# Patient Record
Sex: Male | Born: 1970 | Race: Black or African American | Hispanic: No | Marital: Married | Smoking: Never smoker
Health system: Southern US, Community
[De-identification: ages and names within clinical notes are randomized; demographics above are authoritative.]

## PROBLEM LIST (undated history)

## (undated) DIAGNOSIS — I10 Essential (primary) hypertension: Secondary | ICD-10-CM

## (undated) DIAGNOSIS — F4310 Post-traumatic stress disorder, unspecified: Secondary | ICD-10-CM

## (undated) DIAGNOSIS — G4730 Sleep apnea, unspecified: Secondary | ICD-10-CM

## (undated) HISTORY — PX: WRIST SURGERY: SHX841

---

## 1998-09-05 ENCOUNTER — Emergency Department (HOSPITAL_COMMUNITY): Admission: EM | Admit: 1998-09-05 | Discharge: 1998-09-05 | Payer: Self-pay | Admitting: Emergency Medicine

## 2012-06-01 ENCOUNTER — Encounter (HOSPITAL_COMMUNITY): Payer: Self-pay | Admitting: *Deleted

## 2012-06-01 ENCOUNTER — Emergency Department (HOSPITAL_COMMUNITY): Payer: Self-pay

## 2012-06-01 ENCOUNTER — Emergency Department (HOSPITAL_COMMUNITY)
Admission: EM | Admit: 2012-06-01 | Discharge: 2012-06-01 | Disposition: A | Discharge status: Home / Self Care | Payer: Self-pay | Attending: Emergency Medicine | Admitting: Emergency Medicine

## 2012-06-01 DIAGNOSIS — R0789 Other chest pain: Secondary | ICD-10-CM

## 2012-06-01 DIAGNOSIS — R079 Chest pain, unspecified: Secondary | ICD-10-CM | POA: Insufficient documentation

## 2012-06-01 DIAGNOSIS — I10 Essential (primary) hypertension: Secondary | ICD-10-CM | POA: Insufficient documentation

## 2012-06-01 DIAGNOSIS — Z79899 Other long term (current) drug therapy: Secondary | ICD-10-CM | POA: Insufficient documentation

## 2012-06-01 HISTORY — DX: Essential (primary) hypertension: I10

## 2012-06-01 LAB — CBC WITH DIFFERENTIAL/PLATELET
Basophils Absolute: 0 10*3/uL (ref 0.0–0.1)
Eosinophils Relative: 3 % (ref 0–5)
Lymphocytes Relative: 42 % (ref 12–46)
MCV: 85.3 fL (ref 78.0–100.0)
Neutro Abs: 3.2 10*3/uL (ref 1.7–7.7)
Neutrophils Relative %: 45 % (ref 43–77)
Platelets: 284 10*3/uL (ref 150–400)
RBC: 4.64 MIL/uL (ref 4.22–5.81)
RDW: 12.6 % (ref 11.5–15.5)
WBC: 7.1 10*3/uL (ref 4.0–10.5)

## 2012-06-01 LAB — BASIC METABOLIC PANEL
Chloride: 98 mEq/L (ref 96–112)
GFR calc Af Amer: >90 mL/min (ref >90)
GFR calc non Af Amer: 81 mL/min — ABNORMAL LOW (ref >90)
Potassium: 3.4 mEq/L — ABNORMAL LOW (ref 3.5–5.1)
Sodium: 135 mEq/L (ref 135–145)

## 2012-06-01 LAB — POCT I-STAT TROPONIN I: Troponin i, poc: 0 ng/mL (ref 0.00–0.08)

## 2012-06-01 MED ORDER — POTASSIUM CHLORIDE ER 10 MEQ PO TBCR: 10.0000 meq | EXTENDED_RELEASE_TABLET | ORAL | Status: DC

## 2012-06-01 NOTE — ED Notes (Signed)
41 year old male with a history of intermittent palpitations over the last couple of years, he states that when he has sex or when he talks too much he feels like his heart races. He is not currently having any symptoms,  Physical exam: Patient appears well, soft abdomen, clear lungs, no ectopy.  Assessment:  Chest x-ray, I have personally interpreted this x-ray, no signs of effusions, infiltrates, pneumothorax or abnormal mediastinum. Heart size is normal limits. EKG shows no signs of ischemia or ectopy, potassium 3.4, replacement, followup in the outpatient setting for Holter monitor.  Medical screening examination/treatment/procedure(s) were conducted as a shared visit with non-physician practitioner(s) and myself.  I personally evaluated the patient during the encounter    Vida Roller, MD 06/01/12 [phone]

## 2012-06-01 NOTE — ED Provider Notes (Signed)
History     CSN: [id number]  Arrival date & time 06/01/12  0025   First MD Initiated Contact with Patient 06/01/12 0159      Chief Complaint  Patient presents with  . Chest Pain    (Consider location/radiation/quality/duration/timing/severity/associated sxs/prior treatment) HPI Comments: Patient states that for months he had a funny feeling in his chest with palpitations that is progressively getting worse Now he is feeling weak and very fatigued sleeping a lot His PCP is the [place] but had not been seen in over 6 months  He states he has gained a lot of weight in the past 6 months    Patient is a 41 y.o. male presenting with chest pain. The history is provided by the patient.  Chest Pain The chest pain began more  than 1 month ago. Chest pain occurs frequently. The chest pain is worsening. At its most intense, the pain is at 4/10. The pain is currently at 2/10. The severity of the pain is moderate. The quality of the pain is described as aching. The pain radiates to the left shoulder. Primary symptoms include palpitations. Pertinent negatives for primary symptoms include no fever, no nausea and no dizziness.  The palpitations did not occur with dizziness.  Associated symptoms include weakness.     Past Medical History  Diagnosis Date  . Hypertension     History reviewed. No pertinent past surgical history.  No family history on file.  History  Substance Use Topics  . Smoking status: Never Smoker   . Smokeless tobacco: Not on file  . Alcohol Use: Yes     social      Review of Systems  Constitutional: Negative for fever.  HENT: Negative for congestion and rhinorrhea.   Cardiovascular: Positive for chest pain and palpitations.  Gastrointestinal: Negative for nausea.  Genitourinary: Negative for dysuria.  Skin: Negative for rash.  Neurological: Positive for weakness. Negative for dizziness.    Allergies  Percocet  Home Medications   Current Outpatient Rx  Name  Route Sig Dispense Refill  . HYDROCHLOROTHIAZIDE 25 MG PO TABS Oral Take 25 mg by mouth daily.    Marland Kitchen LISINOPRIL 10 MG PO TABS Oral Take 10 mg by mouth daily.    Marland Kitchen POTASSIUM CHLORIDE ER 10 MEQ PO TBCR Oral Take 1 tablet (10 mEq total) by mouth 1 day or 1 dose. 19 tablet 0    BP 153/93  Pulse 84  Temp 98.5 F (36.9 C)  Resp 16  SpO2 96%  Physical Exam  Constitutional: He appears well-developed.  HENT:  Head: Normocephalic.  Eyes: Pupils are equal, round, and reactive to light.  Neck: Normal range of motion.  Cardiovascular: Normal rate and regular rhythm.   Pulmonary/Chest: Effort normal and breath sounds normal. He has no wheezes.  Abdominal: Soft. He exhibits no distension. There is no tenderness.  Musculoskeletal: Normal range of motion. He exhibits no edema and no tenderness.  Neurological: He is alert.  Skin: Skin is warm and dry. No rash noted.    ED Course  Procedures (including critical care time)  Labs Reviewed  BASIC METABOLIC PANEL - Abnormal; Notable for the following:    Potassium 3.4 (*)     Glucose, Bld 103 (*)     GFR calc non Af Amer 81 (*)     All other components within normal limits  POCT I-STAT TROPONIN I  CBC WITH DIFFERENTIAL   Dg Chest 2 View  06/01/2012  *RADIOLOGY REPORT*  Clinical Data:  Chest pain; heart fluttering sensation.  CHEST - 2 VIEW  Comparison: None.  Findings: The lungs are well-aerated and clear.  There is no evidence of focal opacification, pleural effusion or pneumothorax.  The heart is normal in size; the mediastinal contour is within normal limits.  No acute osseous abnormalities are seen.  IMPRESSION: No acute cardiopulmonary process seen.  Original Report Authenticated By: Tonia Ghent, M.D.     1. Chest discomfort     ED ECG REPORT   Date: 06/01/2012  EKG Time: 5:17 AM  Rate: 85  Rhythm: normal sinus rhythm,  there are no previous tracings available for comparison  Axis: normal  Intervals:none  ST&T Change: none   Narrative Interpretation: normal            MDM  Will evaluate for anemia, EKG and troponin are normal  I think this patient may have sleep apnea resulting in his symptoms will encourage patient to follow up with his PCP at the [facility], NP 06/01/12 0517  Arman Filter, NP 06/01/12 4098

## 2012-06-01 NOTE — ED Notes (Signed)
Pt states having a funny feeling in his chest for several months; pt states when he talks for a while gets a fluttering feeling in his chest; today developed numbness left arm and left shoulder pain; feels llike head is spinning at times when he lays down; no chest pain or tightness at this time

## 2012-06-03 NOTE — ED Provider Notes (Signed)
Medical screening examination/treatment/procedure(s) were performed by non-physician practitioner and as supervising physician I was immediately available for consultation/collaboration.    Vida Roller, MD 06/03/12 2300

## 2012-11-04 ENCOUNTER — Emergency Department (INDEPENDENT_AMBULATORY_CARE_PROVIDER_SITE_OTHER)
Admission: EM | Admit: 2012-11-04 | Discharge: 2012-11-04 | Disposition: A | Discharge status: Home / Self Care | Payer: Self-pay | Source: Home / Self Care | Attending: Family Medicine | Admitting: Family Medicine

## 2012-11-04 ENCOUNTER — Encounter (HOSPITAL_COMMUNITY): Payer: Self-pay

## 2012-11-04 DIAGNOSIS — R05 Cough: Secondary | ICD-10-CM

## 2012-11-04 MED ORDER — CETIRIZINE-PSEUDOEPHEDRINE ER 5-120 MG PO TB12: 1.0000 | ORAL_TABLET | Freq: Two times a day (BID) | ORAL | Status: DC

## 2012-11-04 MED ORDER — GUAIFENESIN-CODEINE 100-10 MG/5ML PO SYRP: 5.0000 mL | ORAL_SOLUTION | Freq: Three times a day (TID) | ORAL | Status: DC | PRN

## 2012-11-04 NOTE — ED Notes (Signed)
C/o cough for past 3 days; no relief w OTC medications; minimal redness post nasopharynx on inspection. C/o pain in ears, mastoid area, brown secretions

## 2012-11-04 NOTE — ED Provider Notes (Signed)
Medical screening examination/treatment/procedure(s) were performed by resident physician or non-physician practitioner and as supervising physician I was immediately available for consultation/collaboration.   Nester Bachus DOUGLAS MD.    Ricki Clack D Laural Eiland, MD 11/04/12 1951 

## 2012-11-04 NOTE — ED Provider Notes (Signed)
History     CSN: [id number]  Arrival date & time 11/04/12  1714   First MD Initiated Contact with Patient 11/04/12 1821      Chief Complaint  Patient presents with  . Cough    (Consider location/radiation/quality/duration/timing/severity/associated sxs/prior treatment) Patient is a 41 y.o. male presenting with cough. The history is provided by the patient.  Cough This is a new problem. The current episode started more than 2 days ago. The problem occurs constantly. The problem has been gradually worsening. The cough is productive of sputum (yellow brown sputum). There has been no fever. Associated symptoms include chills, sweats, ear congestion, headaches, rhinorrhea, sore throat and myalgias. Pertinent negatives include no chest pain, no shortness of breath and no wheezing. He has tried decongestants (nyquil and robitussin) for the symptoms. The treatment provided mild relief. Risk factors: no recent travel or chemical exposure. He is not a smoker. His past medical history does not include bronchitis, pneumonia or asthma.    Past Medical History  Diagnosis Date  . Hypertension     History reviewed. No pertinent past surgical history.  History reviewed. No pertinent family history.  History  Substance Use Topics  . Smoking status: Never Smoker   . Smokeless tobacco: Not on file  . Alcohol Use: Yes     Comment: social      Review of Systems  Constitutional: Positive for chills.  HENT: Positive for sore throat and rhinorrhea.   Respiratory: Positive for cough. Negative for shortness of breath and wheezing.   Cardiovascular: Negative for chest pain.  Musculoskeletal: Positive for myalgias.  Neurological: Positive for headaches.  All other systems reviewed and are negative.    Allergies  Percocet  Home Medications   Current Outpatient Rx  Name  Route  Sig  Dispense  Refill  . CETIRIZINE-PSEUDOEPHEDRINE ER 5-120 MG PO TB12   Oral   Take 1 tablet by mouth 2 (two)  times daily.   30 tablet   1   . GUAIFENESIN-CODEINE 100-10 MG/5ML PO SYRP   Oral   Take 5 mLs by mouth 3 (three) times daily as needed for cough.   120 mL   0   . HYDROCHLOROTHIAZIDE 25 MG PO TABS   Oral   Take 25 mg by mouth daily.         Marland Kitchen LISINOPRIL 10 MG PO TABS   Oral   Take 10 mg by mouth daily.         Marland Kitchen POTASSIUM CHLORIDE ER 10 MEQ PO TBCR   Oral   Take 1 tablet (10 mEq total) by mouth 1 day or 1 dose.   19 tablet   0     BP 160/111  Pulse 90  Temp 98.6 F (37 C) (Oral)  Resp 20  SpO2 99%  Physical Exam  Nursing note and vitals reviewed. Constitutional: He is oriented to person, place, and time. Vital signs are normal. He appears well-developed and well-nourished. He is active and cooperative.  HENT:  Head: Normocephalic.  Right Ear: Tympanic membrane and external ear normal.  Left Ear: Tympanic membrane and external ear normal.  Nose: Rhinorrhea present.  Mouth/Throat: Uvula is midline and mucous membranes are normal. Posterior oropharyngeal erythema present. No oropharyngeal exudate.       Post nasal drip  Eyes: Conjunctivae normal are normal. Pupils are equal, round, and reactive to light. No scleral icterus.  Neck: Trachea normal. Neck supple.  Cardiovascular: Normal rate, regular rhythm, normal heart sounds, intact distal  pulses and normal pulses.   Pulmonary/Chest: Effort normal and breath sounds normal.  Lymphadenopathy:       Head (right side): No submental, no submandibular, no tonsillar, no preauricular, no posterior auricular and no occipital adenopathy present.       Head (left side): No submental, no submandibular, no tonsillar, no preauricular, no posterior auricular and no occipital adenopathy present.    He has no cervical adenopathy.  Neurological: He is alert and oriented to person, place, and time. No cranial nerve deficit or sensory deficit.  Skin: Skin is warm and dry. No rash noted.  Psychiatric: He has a normal mood and affect.  His speech is normal and behavior is normal. Judgment and thought content normal. Cognition and memory are normal.    ED Course  Procedures (including critical care time)  Labs Reviewed - No data to display No results found.   1. Cough       MDM  Increase fluid intake, rest.  Antibiotics not indicated.  Begin expectorant/decongestant, topical decongestant, saline nasal spray and/or saline irrigation, and cough suppressant at bedtime. Antihistamines of your choice (Claritin or Zyrtec).  Tylenol or Motrin for fever/discomfort.  Followup with PCP if not improving 5 to 7 days.       Johnsie Kindred, NP 11/04/12 1828

## 2012-11-19 ENCOUNTER — Encounter (HOSPITAL_COMMUNITY): Payer: Self-pay | Admitting: Emergency Medicine

## 2012-11-19 ENCOUNTER — Emergency Department (HOSPITAL_COMMUNITY)
Admission: EM | Admit: 2012-11-19 | Discharge: 2012-11-20 | Disposition: A | Discharge status: Home / Self Care | Payer: Self-pay | Attending: Emergency Medicine | Admitting: Emergency Medicine

## 2012-11-19 DIAGNOSIS — H6690 Otitis media, unspecified, unspecified ear: Secondary | ICD-10-CM | POA: Insufficient documentation

## 2012-11-19 DIAGNOSIS — Z79899 Other long term (current) drug therapy: Secondary | ICD-10-CM | POA: Insufficient documentation

## 2012-11-19 DIAGNOSIS — R509 Fever, unspecified: Secondary | ICD-10-CM | POA: Insufficient documentation

## 2012-11-19 DIAGNOSIS — I10 Essential (primary) hypertension: Secondary | ICD-10-CM | POA: Insufficient documentation

## 2012-11-19 DIAGNOSIS — J3489 Other specified disorders of nose and nasal sinuses: Secondary | ICD-10-CM | POA: Insufficient documentation

## 2012-11-19 NOTE — ED Provider Notes (Signed)
History     CSN: [id number]  Arrival date & time 11/19/12  2244   First MD Initiated Contact with Patient 11/19/12 2256      Chief Complaint  Patient presents with  . Otalgia    (Consider location/radiation/quality/duration/timing/severity/associated sxs/prior treatment) HPI Comments: Patient presenting with right ear pain.  His daughter recently diagnosed with AOM.    Patient is a 41 y.o. male presenting with ear pain. The history is provided by the patient.  Otalgia This is a new problem. The current episode started 12 to 24 hours ago. There is pain in the right ear. The problem occurs constantly. The problem has been gradually worsening. The maximum temperature recorded prior to his arrival was 100 to 100.9 F. The fever has been present for less than 1 day. Pertinent negatives include no ear discharge, no rhinorrhea and no cough.    Past Medical History  Diagnosis Date  . Hypertension     History reviewed. No pertinent past surgical history.  History reviewed. No pertinent family history.  History  Substance Use Topics  . Smoking status: Never Smoker   . Smokeless tobacco: Not on file  . Alcohol Use: Yes     Comment: social      Review of Systems  Constitutional: Positive for fever and chills.  HENT: Positive for ear pain and congestion. Negative for rhinorrhea, sinus pressure and ear discharge.   Respiratory: Negative for cough, shortness of breath and wheezing.     Allergies  Percocet  Home Medications   Current Outpatient Rx  Name  Route  Sig  Dispense  Refill  . VITAMIN D 1000 UNITS PO TABS   Oral   Take 1,000 Units by mouth every morning.         Marland Kitchen HYDROCHLOROTHIAZIDE 25 MG PO TABS   Oral   Take 25 mg by mouth every morning.          Marland Kitchen LISINOPRIL 10 MG PO TABS   Oral   Take 10 mg by mouth every morning.          Marland Kitchen VITAMIN C 500 MG PO TABS   Oral   Take 500 mg by mouth every morning.           BP 175/113  Pulse 97  Temp 99.7 F  (37.6 C) (Oral)  Resp 16  SpO2 95%  Physical Exam  Nursing note and vitals reviewed. Constitutional: He appears well-developed and well-nourished. No distress.  HENT:  Head: Normocephalic and atraumatic.  Right Ear: Tympanic membrane is erythematous and bulging. Tympanic membrane is not perforated.  Left Ear: Tympanic membrane and ear canal normal.  Nose: Nose normal. Right sinus exhibits no maxillary sinus tenderness and no frontal sinus tenderness. Left sinus exhibits no maxillary sinus tenderness and no frontal sinus tenderness.  Mouth/Throat: Uvula is midline, oropharynx is clear and moist and mucous membranes are normal.  Neck: Normal range of motion. Neck supple.  Cardiovascular: Normal rate, regular rhythm and normal heart sounds.   Pulmonary/Chest: Effort normal and breath sounds normal. He has no wheezes.  Neurological: He is alert.  Skin: Skin is warm and dry. He is not diaphoretic. No erythema.  Psychiatric: He has a normal mood and affect.    ED Course  Procedures (including critical care time)  Labs Reviewed - No data to display No results found.   No diagnosis found.    MDM  Signs and symptoms consistent with AOM of right ear.  Patient discharged home with  antibiotics.        Pascal Lux [place], PA-C 11/20/12 2202

## 2012-11-19 NOTE — ED Notes (Signed)
Patient reports that he started to have pain to his right ear today

## 2012-11-20 MED ORDER — AMOXICILLIN 500 MG PO CAPS: 1000.0000 mg | ORAL_CAPSULE | Freq: Three times a day (TID) | ORAL | Status: DC

## 2012-11-20 MED ORDER — TRAMADOL HCL 50 MG PO TABS: 50.0000 mg | ORAL_TABLET | Freq: Four times a day (QID) | ORAL | Status: DC | PRN

## 2012-11-20 NOTE — ED Provider Notes (Signed)
Medical screening examination/treatment/procedure(s) were performed by non-physician practitioner and as supervising physician I was immediately available for consultation/collaboration.  Erich Kochan T Letishia Elliott, MD 11/20/12 2220 

## 2016-02-07 ENCOUNTER — Emergency Department (HOSPITAL_COMMUNITY)
Admission: EM | Admit: 2016-02-07 | Discharge: 2016-02-07 | Disposition: A | Discharge status: Home / Self Care | Payer: Self-pay | Attending: Emergency Medicine | Admitting: Emergency Medicine

## 2016-02-07 ENCOUNTER — Encounter (HOSPITAL_COMMUNITY): Payer: Self-pay | Admitting: *Deleted

## 2016-02-07 DIAGNOSIS — S199XXA Unspecified injury of neck, initial encounter: Secondary | ICD-10-CM | POA: Insufficient documentation

## 2016-02-07 DIAGNOSIS — S3992XA Unspecified injury of lower back, initial encounter: Secondary | ICD-10-CM | POA: Insufficient documentation

## 2016-02-07 DIAGNOSIS — Y92410 Unspecified street and highway as the place of occurrence of the external cause: Secondary | ICD-10-CM | POA: Insufficient documentation

## 2016-02-07 DIAGNOSIS — Y9389 Activity, other specified: Secondary | ICD-10-CM | POA: Insufficient documentation

## 2016-02-07 DIAGNOSIS — S0990XA Unspecified injury of head, initial encounter: Secondary | ICD-10-CM | POA: Insufficient documentation

## 2016-02-07 DIAGNOSIS — I10 Essential (primary) hypertension: Secondary | ICD-10-CM | POA: Insufficient documentation

## 2016-02-07 DIAGNOSIS — Y998 Other external cause status: Secondary | ICD-10-CM | POA: Insufficient documentation

## 2016-02-07 MED ORDER — NAPROXEN 375 MG PO TABS: 375.0000 mg | ORAL_TABLET | Freq: Two times a day (BID) | ORAL | Status: DC

## 2016-02-07 MED ORDER — CYCLOBENZAPRINE HCL 10 MG PO TABS
10.0000 mg | ORAL_TABLET | Freq: Three times a day (TID) | ORAL | Status: DC | PRN
Start: 2016-02-07 — End: 2020-03-05

## 2016-02-07 MED ORDER — TRAMADOL HCL 50 MG PO TABS: 50.0000 mg | ORAL_TABLET | Freq: Four times a day (QID) | ORAL | Status: DC | PRN
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
## 2018-05-04 ENCOUNTER — Other Ambulatory Visit: Payer: Self-pay | Admitting: Nephrology

## 2018-05-04 DIAGNOSIS — I10 Essential (primary) hypertension: Secondary | ICD-10-CM

## 2018-05-11 ENCOUNTER — Other Ambulatory Visit: Payer: Self-pay | Admitting: Nephrology

## 2018-05-11 DIAGNOSIS — I10 Essential (primary) hypertension: Secondary | ICD-10-CM

## 2018-05-31 ENCOUNTER — Ambulatory Visit
Admission: RE | Admit: 2018-05-31 | Discharge: 2018-05-31 | Disposition: A | Discharge status: Home / Self Care | Payer: No Typology Code available for payment source | Source: Ambulatory Visit | Attending: Nephrology | Admitting: Nephrology

## 2018-05-31 DIAGNOSIS — I10 Essential (primary) hypertension: Secondary | ICD-10-CM

## 2020-02-01 IMAGING — US US RENAL ARTERY STENOSIS
1 series · 13 of 25 positions shown · non-contrast
Comparison: None.

CLINICAL DATA: Hypertension.  Evaluate for renal artery stenosis.

EXAM:
RENAL/URINARY TRACT ULTRASOUND
RENAL DUPLEX DOPPLER ULTRASOUND

[Series 1: us renal artery stenosis · 0.33mm/px · 13 of 81 slices shown]
[im 1/81]
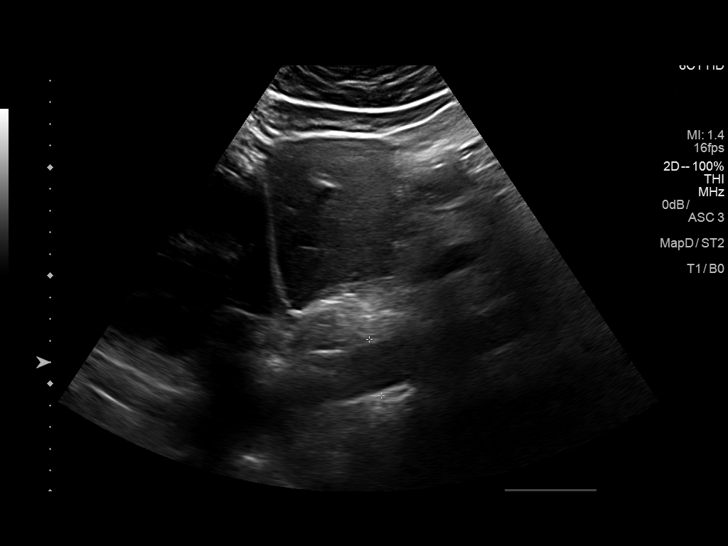
[im 7/81]
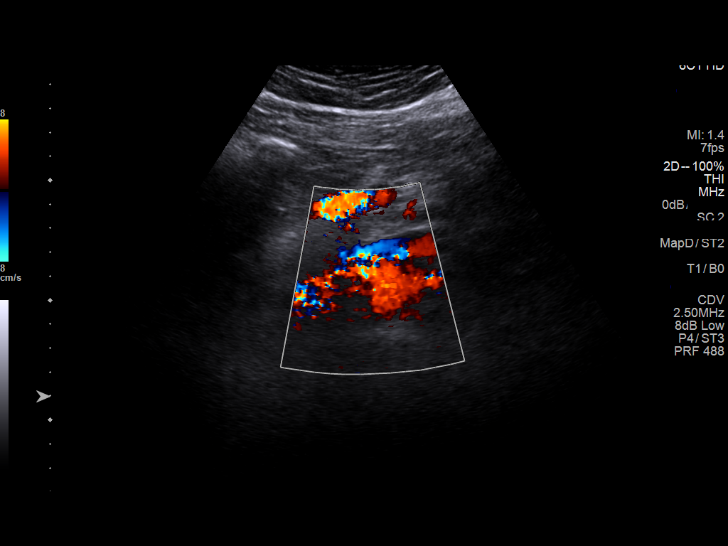
[im 14/81]
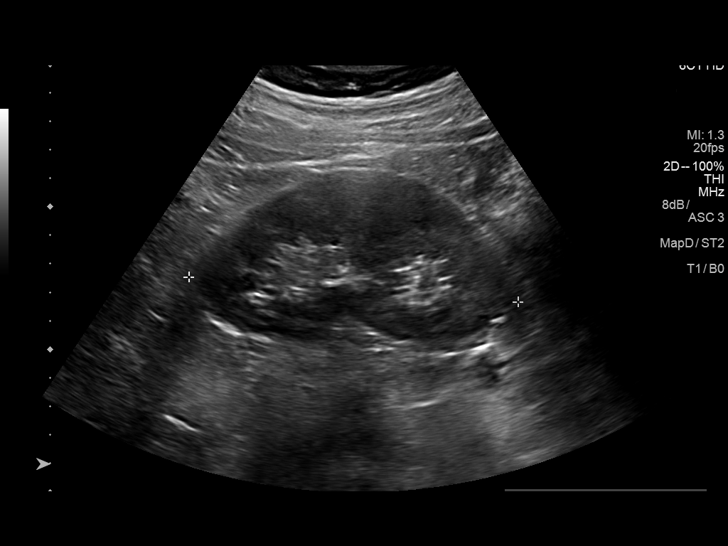
[im 21/81]
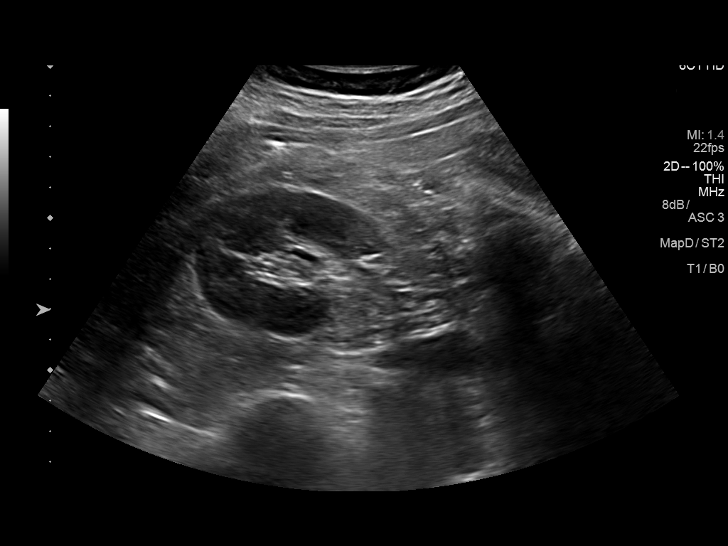
[im 27/81]
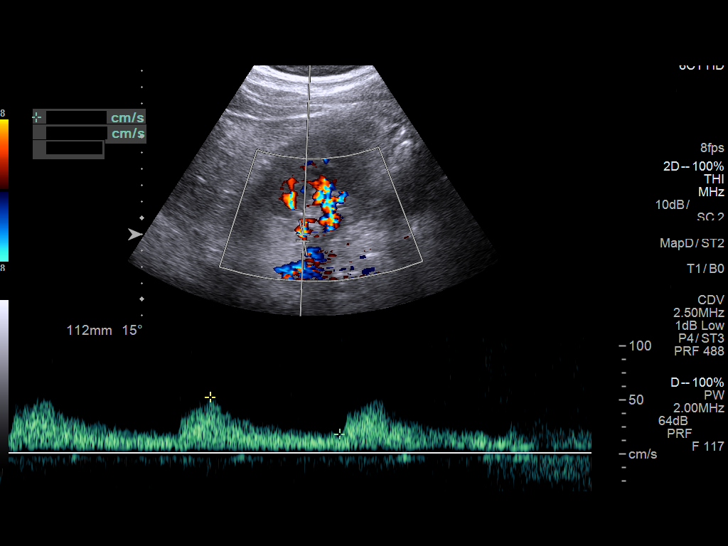
[im 34/81]
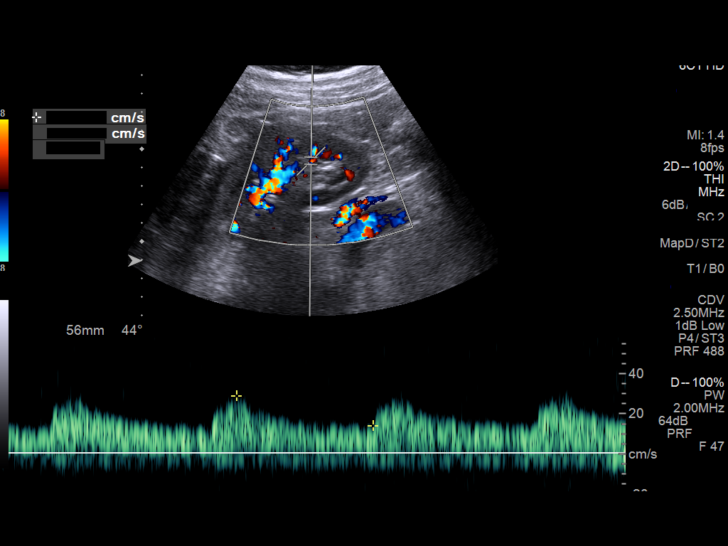
[im 41/81]
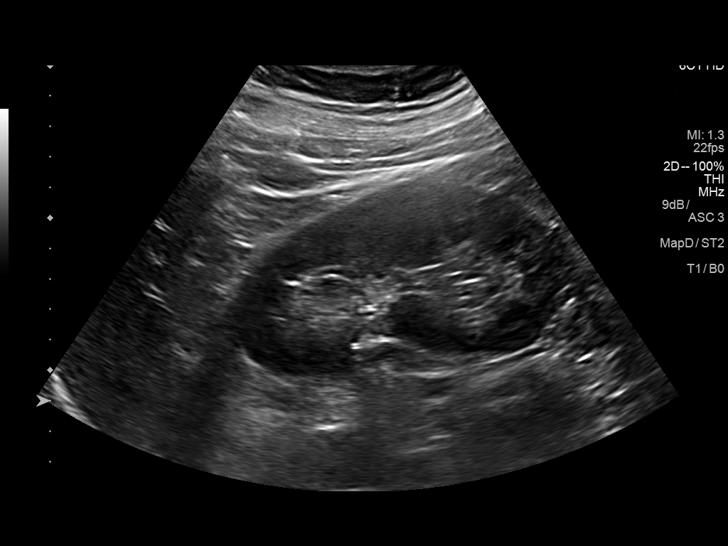
[im 47/81]
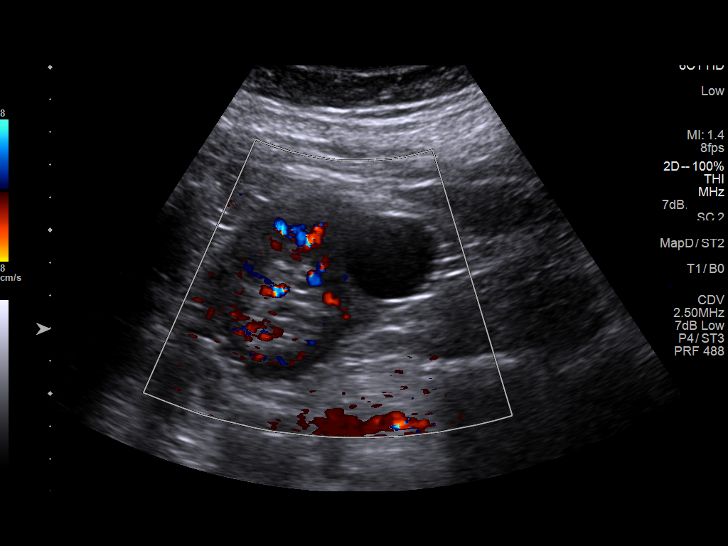
[im 54/81]
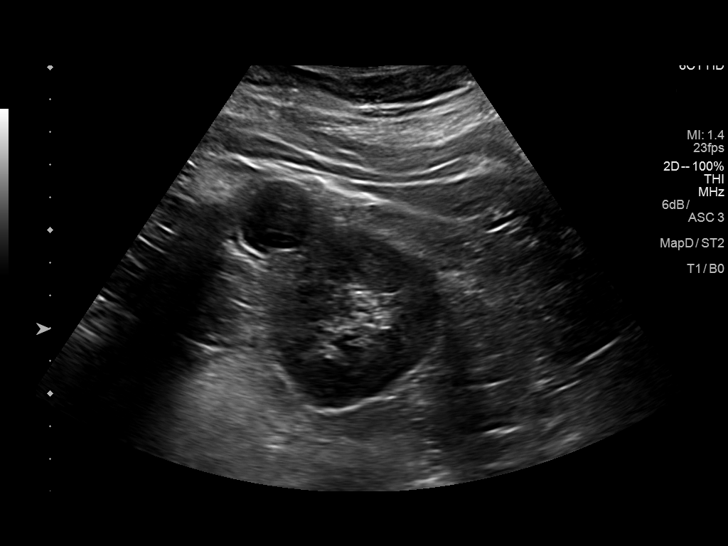
[im 61/81]
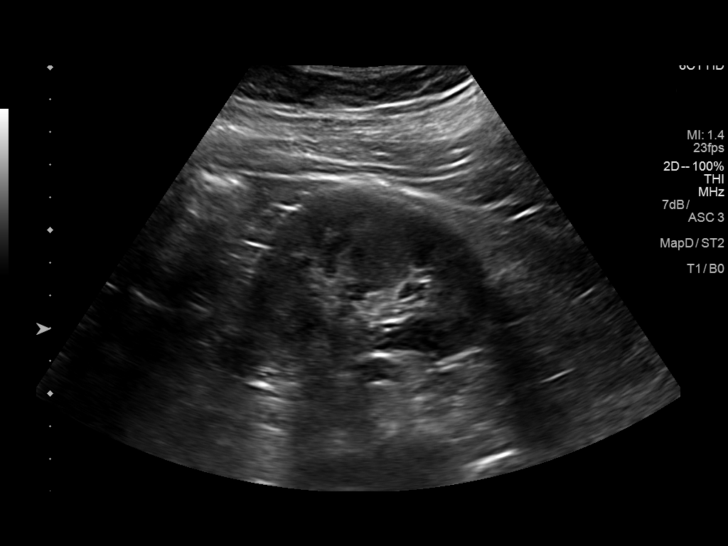
[im 67/81]
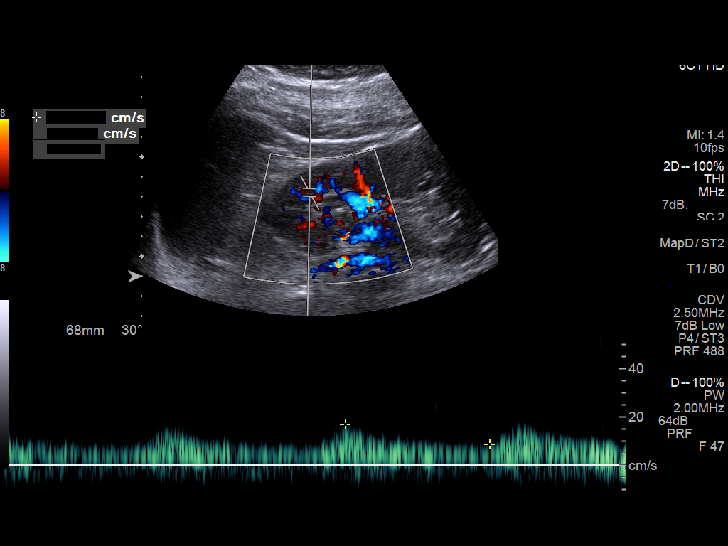
[im 74/81]
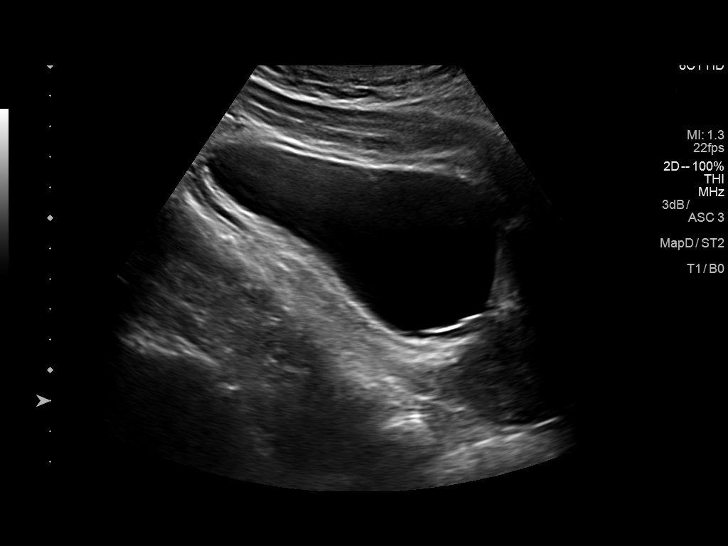
[im 81/81]
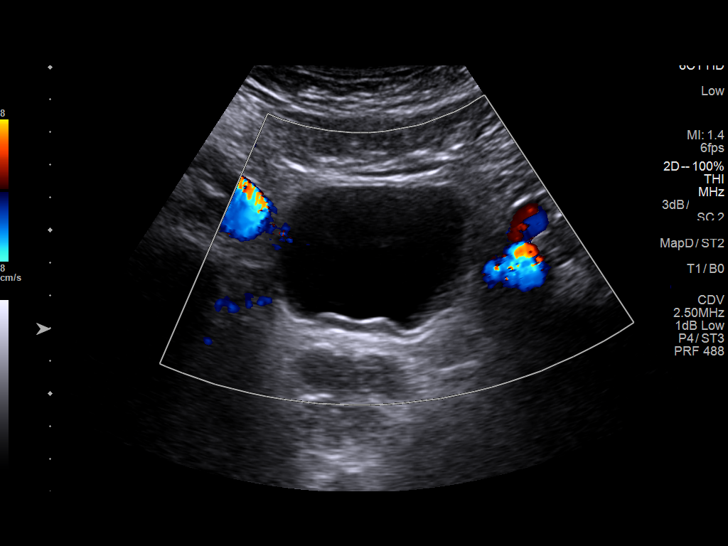

[13 of 25 positions shown; findings below may reference images not displayed]

FINDINGS: Right Kidney:

Normal cortical thickness, echogenicity and size, measuring 11.6 cm
in length. No focal renal lesions. No echogenic renal stones. No
urinary obstruction.

Left Kidney:

Normal cortical thickness, echogenicity and size, measuring 11.4 cm
in length. Note is made of an approximately 2.9 x 2.5 x 2.9 cm
anechoic cyst arising from the interpolar aspect of the left kidney
as well as an approximately 2.7 x 2.4 x 2.3 cm anechoic exophytic
cyst arising from the superior/mid aspect of the left kidney. No
echogenic renal stones. No urinary obstruction.

Bladder:  Appears normal given degree distention.

RENAL DUPLEX ULTRASOUND

Right Renal Artery Velocities:

Origin:  79 cm/sec

Mid:  53 cm/sec

Hilum:  71 cm/sec

Interlobar:  36 cm/sec

Arcuate:  20 cm/sec

Left Renal Artery Velocities:

Origin:  70 cm/sec

Mid:  79 cm/sec

Hilum:  59 cm/sec

Interlobar:  30 cm/sec

Arcuate:  17 cm/sec

Aortic Velocity:  72 cm/sec

Right Renal-Aortic Ratios:

Origin:

Mid:

Hilum:

Interlobar:

Arcuate:

Left Renal-Aortic Ratios:

Origin:

Mid:

Hilum:

Interlobar:

Arcuate:

Normal velocities and low resistance waveforms are demonstrated
throughout the bilateral renal arteries and renal parenchyma.

The bilateral renal veins appear patent.

Normal caliber of the abdominal aorta.
IMPRESSION: 1. No evidence of renal artery stenosis.
2. Incidentally noted 2 partially exophytic left-sided renal cysts.
Otherwise, normal renal ultrasound.

## 2020-02-08 ENCOUNTER — Encounter: Payer: Self-pay | Admitting: Gastroenterology

## 2020-03-05 ENCOUNTER — Telehealth (INDEPENDENT_AMBULATORY_CARE_PROVIDER_SITE_OTHER): Payer: No Typology Code available for payment source | Admitting: Gastroenterology

## 2020-03-05 ENCOUNTER — Encounter: Payer: Self-pay | Admitting: Gastroenterology

## 2020-03-05 ENCOUNTER — Other Ambulatory Visit: Payer: Self-pay

## 2020-03-05 VITALS — Ht 68.0 in | Wt 215.0 lb

## 2020-03-05 DIAGNOSIS — R1084 Generalized abdominal pain: Secondary | ICD-10-CM | POA: Diagnosis not present

## 2020-03-05 DIAGNOSIS — K219 Gastro-esophageal reflux disease without esophagitis: Secondary | ICD-10-CM

## 2020-03-05 DIAGNOSIS — Z1211 Encounter for screening for malignant neoplasm of colon: Secondary | ICD-10-CM

## 2020-03-05 DIAGNOSIS — R194 Change in bowel habit: Secondary | ICD-10-CM

## 2020-03-05 DIAGNOSIS — R630 Anorexia: Secondary | ICD-10-CM

## 2020-03-05 DIAGNOSIS — R6889 Other general symptoms and signs: Secondary | ICD-10-CM | POA: Diagnosis not present

## 2020-03-05 MED ORDER — CLENPIQ 10-3.5-12 MG-GM -GM/160ML PO SOLN
1.0000 | Freq: Once | ORAL | 0 refills | Status: AC
Start: 2020-03-05 — End: 2020-03-05

## 2020-03-05 NOTE — Progress Notes (Signed)
Chief Complaint: GERD, colon cancer screening, weight fluctuation, abdominal pain, change in bowel habits  Referring Provider:    Chipper Herb   HPI:    Due to current restrictions/limitations of in-office visits due to the COVID-19 pandemic, this scheduled clinical appointment was converted to a telehealth virtual consultation using MyChart video.  Patient was unable to connect using MyChart video on multiple times, so converted to telephone call per patient preference.  -Time of medical discussion: 22 minutes -The patient did consent to this virtual visit and is aware of possible charges through their insurance for this visit.  -Names of all parties present: David Curry (patient), Gerrit Heck, DO, [facility] (physician) -Patient location: Home -Physician location: Office  David Curry is a 49 y.o. male with a hx of HTN, OSA, GERD, PTSD with memory issues, [facility] evaluation of GERD, change in bowel habits, abdominal pain, along with colon cancer screening.  He states he has generalized abdominal pain, mostly in the lower abdomen. Has changes in bowel habits, described as both diarrhea and constipation. Pain typically only when having BM, then resolves after BM.  Symptoms have been present for a number of years.  No hematochezia or melena.   Appetite fluctuates as well.  No nausea/vomiting.  Weight fluctuates up/down by 10#, which typically mirrors appetite changes.  No night sweats, fever, chills.  Long standing hx of reflux sxs (HB, regurgitation). Sxs are intermittent. Has had a few episodes of nocturnal sxs of regurgitation, choking. Started Protonix 40 mg/day approx 1 month ago by the [place]. prior to that, no acid suppression therapy, antacids, etc.  No dysphagia.  Increased flatus over the last several years as well. Started Lactobacillus and Metamucil 1 month ago by the [place] with no appreciable change.   He follows at  the [facility]. medical records reviewed and notable for the following: -Referral for change in bowel habits, weight loss -Service requested: EGD and/or colonoscopy -Labs from 10/05/2019: AST/ALT 11/28, T bili 0.4, creatinine 1.12, H/H 14.4/42.2, PLT 337, WBC 7.88, K3.3, A1c 5.5  No previous EGD or colonoscopy.  FHx relatively unknown to patient.   Past medical history, past surgical history, social history, family history, medications, and allergies reviewed in the chart and with patient.    Past Medical History:  Diagnosis Date  . Hypertension      Past Surgical History:  Procedure Laterality Date  . WRIST SURGERY     Family History  Adopted: Yes   Social History   Tobacco Use  . Smoking status: Never Smoker  . Smokeless tobacco: Never Used  Substance Use Topics  . Alcohol use: Yes    Comment: social  . Drug use: Not on file   Current Outpatient Medications  Medication Sig Dispense Refill  . amLODipine (NORVASC) 10 MG tablet Take by mouth daily.    . citalopram (CELEXA) 10 MG tablet Take 10 mg by mouth daily.    . hydrOXYzine (ATARAX/VISTARIL) 25 MG tablet Take 25 mg by mouth 3 (three) times daily as needed.    Marland Kitchen ibuprofen (ADVIL) 800 MG tablet Take 800 mg by mouth every 8 (eight) hours as needed.    Marland Kitchen LACTOBACILLUS PO Take by mouth.    . losartan (COZAAR) 50 MG tablet Take by mouth daily.    . pantoprazole (PROTONIX) 40 MG tablet Take 40 mg by mouth daily.    . psyllium (METAMUCIL) 58.6 %  powder Take 1 packet by mouth 3 (three) times daily.    . sildenafil (VIAGRA) 100 MG tablet Take 100 mg by mouth daily as needed for erectile dysfunction.    . traZODone (DESYREL) 50 MG tablet Take 50 mg by mouth at bedtime.     No current facility-administered medications for this visit.   Allergies  Allergen Reactions  . Percocet [Oxycodone-Acetaminophen] Rash     Review of Systems: All systems reviewed and negative except where noted in HPI.     Physical Exam:    Complete  physical exam not completed due to the nature of this telehealth communication.    ASSESSMENT AND PLAN;   1) Change in bowel habits 2) Generalized abdominal pain 3) Increased flatus -Longstanding history of alternating bowel habits with generalized (mostly lower) abdominal pain.  No appreciable change with recent initiation of Metamucil and probiotic. -EGD with duodenal biopsies -Colonoscopy with random and directed biopsies to eval for MC, IBD, etc.  4) Weight fluctuations 5) Decreased appetite -EGD to assess for luminal/mucosal pathology, gastritis, PUD, GOO, etc. with gastric biopsies -Recently started on PPI.  Can assess for clinical improvement with adequate PPI trial -If above work-up unrevealing, query medication effect, underlying psych overlay (history of PTSD)  6) GERD: -Does have some typical reflux symptoms along with infrequent nocturnal reflux symptoms -Assess for LES laxity, hiatal hernia, erosive esophagitis at time of EGD as above -Continue current PPI  7) Colon cancer screening: -Due for age-appropriate, average risk CRC screening -Schedule for colonoscopy as above  The indications, risks, and benefits of EGD and colonoscopy were explained to the patient in detail. Risks include but are not limited to bleeding, perforation, adverse reaction to medications, and cardiopulmonary compromise. Sequelae include but are not limited to the possibility of surgery, hositalization, and mortality. The patient verbalized understanding and wished to proceed. All questions answered, referred to scheduler and bowel prep ordered. Further recommendations pending results of the exam.    Lavena Bullion, DO, FACG  03/05/2020, 10:46 AM   Anson [place], MD

## 2020-04-05 ENCOUNTER — Telehealth: Payer: Self-pay | Admitting: Gastroenterology

## 2020-04-05 NOTE — Telephone Encounter (Signed)
Pt called stating that Clenpiq is too expensive. He is asking if prescription can be sent to the [place]. His procedure is on 5/18.

## 2020-04-08 ENCOUNTER — Other Ambulatory Visit: Payer: Self-pay

## 2020-04-08 MED ORDER — CLENPIQ 10-3.5-12 MG-GM -GM/160ML PO SOLN: 1.0000 | Freq: Once | ORAL | 0 refills | Status: AC

## 2020-04-08 NOTE — Telephone Encounter (Signed)
Spoke to patient. Clenpiq sent to [facility]

## 2020-04-09 ENCOUNTER — Other Ambulatory Visit: Payer: Self-pay

## 2020-04-09 ENCOUNTER — Encounter: Payer: Self-pay | Admitting: Gastroenterology

## 2020-04-09 ENCOUNTER — Ambulatory Visit (AMBULATORY_SURGERY_CENTER): Payer: No Typology Code available for payment source | Admitting: Gastroenterology

## 2020-04-09 VITALS — BP 144/99 | HR 66 | Temp 97.1°F | Resp 13 | Ht 68.0 in | Wt 215.0 lb

## 2020-04-09 DIAGNOSIS — D123 Benign neoplasm of transverse colon: Secondary | ICD-10-CM | POA: Diagnosis not present

## 2020-04-09 DIAGNOSIS — K219 Gastro-esophageal reflux disease without esophagitis: Secondary | ICD-10-CM

## 2020-04-09 DIAGNOSIS — K649 Unspecified hemorrhoids: Secondary | ICD-10-CM

## 2020-04-09 DIAGNOSIS — D125 Benign neoplasm of sigmoid colon: Secondary | ICD-10-CM

## 2020-04-09 DIAGNOSIS — K3189 Other diseases of stomach and duodenum: Secondary | ICD-10-CM | POA: Diagnosis not present

## 2020-04-09 DIAGNOSIS — R194 Change in bowel habit: Secondary | ICD-10-CM

## 2020-04-09 DIAGNOSIS — R197 Diarrhea, unspecified: Secondary | ICD-10-CM

## 2020-04-09 DIAGNOSIS — K2970 Gastritis, unspecified, without bleeding: Secondary | ICD-10-CM

## 2020-04-09 DIAGNOSIS — K640 First degree hemorrhoids: Secondary | ICD-10-CM

## 2020-04-09 DIAGNOSIS — K2980 Duodenitis without bleeding: Secondary | ICD-10-CM | POA: Diagnosis not present

## 2020-04-09 DIAGNOSIS — R12 Heartburn: Secondary | ICD-10-CM

## 2020-04-09 DIAGNOSIS — K2990 Gastroduodenitis, unspecified, without bleeding: Secondary | ICD-10-CM

## 2020-04-09 DIAGNOSIS — D124 Benign neoplasm of descending colon: Secondary | ICD-10-CM

## 2020-04-09 DIAGNOSIS — D122 Benign neoplasm of ascending colon: Secondary | ICD-10-CM | POA: Diagnosis not present

## 2020-04-09 DIAGNOSIS — R630 Anorexia: Secondary | ICD-10-CM

## 2020-04-09 DIAGNOSIS — R1084 Generalized abdominal pain: Secondary | ICD-10-CM

## 2020-04-09 MED ORDER — SODIUM CHLORIDE 0.9 % IV SOLN: 500.0000 mL | Freq: Once | INTRAVENOUS | Status: DC

## 2020-04-09 MED ORDER — PANTOPRAZOLE SODIUM 40 MG PO TBEC
40.0000 mg | DELAYED_RELEASE_TABLET | Freq: Two times a day (BID) | ORAL | 0 refills | Status: AC
Start: 2020-04-09 — End: 2020-05-21

## 2020-04-09 NOTE — Op Note (Signed)
[facility] Patient Name: David Curry Procedure Date: 04/09/2020 9:04 AM MRN: [id number] Endoscopist: Gerrit Heck , MD Age: 49 Referring MD:  Date of Birth: 11-22-1971 Gender: Male Account #: [id number] Procedure:                Colonoscopy Indications:              This is the patient's first colonoscopy,                            Generalized abdominal pain, Clinically significant                            diarrhea of unexplained origin, Change in bowel                            habits Medicines:                Monitored Anesthesia Care Procedure:                Pre-Anesthesia Assessment:                           - Prior to the procedure, a History and Physical                            was performed, and patient medications and                            allergies were reviewed. The patient's tolerance of                            previous anesthesia was also reviewed. The risks                            and benefits of the procedure and the sedation                            options and risks were discussed with the patient.                            All questions were answered, and informed consent                            was obtained. Prior Anticoagulants: The patient has                            taken no previous anticoagulant or antiplatelet                            agents. ASA Grade Assessment: II - A patient with                            mild systemic disease. After reviewing the risks  and benefits, the patient was deemed in                            satisfactory condition to undergo the procedure.                           After obtaining informed consent, the colonoscope                            was passed under direct vision. Throughout the                            procedure, the patient's blood pressure, pulse, and                            oxygen saturations were monitored continuously. The                  Colonoscope was introduced through the anus and                            advanced to the the terminal ileum. The colonoscopy                            was performed without difficulty. The patient                            tolerated the procedure well. The quality of the                            bowel preparation was good. The terminal ileum,                            ileocecal valve, appendiceal orifice, and rectum                            were photographed. Scope In: 9:18:35 AM Scope Out: 9:40:15 AM Scope Withdrawal Time: 0 hours 19 minutes 59 seconds  Total Procedure Duration: 0 hours 21 minutes 40 seconds  Findings:                 The perianal and digital rectal examinations were                            normal.                           Seven sessile polyps were found in the sigmoid                            colon (1), descending colon (3), transverse colon                            (2) and ascending colon (1). The polyps were 3 to 6  mm in size. These polyps were removed with a cold                            snare. Resection and retrieval were complete.                            Estimated blood loss was minimal.                           Normal mucosa was otherwise found in the remainder                            of the colon. Biopsies for histology were taken                            with a cold forceps from the right colon and left                            colon for evaluation of microscopic colitis.                            Estimated blood loss was minimal.                           Non-bleeding internal hemorrhoids were found during                            retroflexion. The hemorrhoids were small.                           An area of mucosa in the terminal ileum was mildly                            erythematous. No ulcers or aphthae noted. Biopsies                            were taken with a cold forceps for  histology.                            Estimated blood loss was minimal. Complications:            No immediate complications. Estimated Blood Loss:     Estimated blood loss was minimal. Impression:               - Seven 3 to 6 mm polyps in the sigmoid colon, in                            the descending colon, in the transverse colon and                            in the ascending colon, removed with a cold snare.  Resected and retrieved.                           - Normal mucosa in the entire examined colon.                            Biopsied.                           - Non-bleeding internal hemorrhoids.                           - Localized area of mildly erythematous mucosa in                            the terminal ileum. Biopsied. Recommendation:           - Patient has a contact number available for                            emergencies. The signs and symptoms of potential                            delayed complications were discussed with the                            patient. Return to normal activities tomorrow.                            Written discharge instructions were provided to the                            patient.                           - Resume previous diet.                           - Continue present medications.                           - Await pathology results.                           - Repeat colonoscopy for surveillance based on                            pathology results.                           - Return to GI office at appointment to be                            scheduled.                           - Use fiber, for example Citrucel, Fibercon, [employer]  or Metamucil. Gerrit Heck, MD 04/09/2020 9:51:54 AM

## 2020-04-09 NOTE — Patient Instructions (Signed)
HANDOUTS PROVIDED ON: GASTRITIS, POLYPS, & HEMORRHOIDS  The polyps removed/biopsies taken today have been sent for pathology.  The results can take 1-3 weeks to receive.  When your next colonoscopy should occur will be based on the pathology results.    You may resume your previous diet.  You may resume your current medications but increase Protonix (pantoprazole) to 40 mg twice a day for 6 weeks then reduce back to 40 mg once a day.  You should also use some form of OTC fiber such as Citrucel, Fibercon, Konsyl, or Metamucil.  Thank you for allowing [place] to care for you today!!!   YOU HAD AN ENDOSCOPIC PROCEDURE TODAY AT [facility]:   Refer to the procedure report that was given to you for any specific questions about what was found during the examination.  If the procedure report does not answer your questions, please call your gastroenterologist to clarify.  If you requested that your care partner not be given the details of your procedure findings, then the procedure report has been included in a sealed envelope for you to review at your convenience later.  YOU SHOULD EXPECT: Some feelings of bloating in the abdomen. Passage of more gas than usual.  Walking can help get rid of the air that was put into your GI tract during the procedure and reduce the bloating. If you had a lower endoscopy (such as a colonoscopy or flexible sigmoidoscopy) you may notice spotting of blood in your stool or on the toilet paper. If you underwent a bowel prep for your procedure, you may not have a normal bowel movement for a few days.  Please Note:  You might notice some irritation and congestion in your nose or some drainage.  This is from the oxygen used during your procedure.  There is no need for concern and it should clear up in a day or so.  SYMPTOMS TO REPORT IMMEDIATELY:   Following lower endoscopy (colonoscopy or flexible sigmoidoscopy):  Excessive amounts of blood in the  stool  Significant tenderness or worsening of abdominal pains  Swelling of the abdomen that is new, acute  Fever of 100F or higher   Following upper endoscopy (EGD)  Vomiting of blood or coffee ground material  New chest pain or pain under the shoulder blades  Painful or persistently difficult swallowing  New shortness of breath  Fever of 100F or higher  Black, tarry-looking stools  For urgent or emergent issues, a gastroenterologist can be reached at any hour by calling [phone]. Do not use MyChart messaging for urgent concerns.    DIET:  We do recommend a small meal at first, but then you may proceed to your regular diet.  Drink plenty of fluids but you should avoid alcoholic beverages for 24 hours.  ACTIVITY:  You should plan to take it easy for the rest of today and you should NOT DRIVE or use heavy machinery until tomorrow (because of the sedation medicines used during the test).    FOLLOW UP: Our staff will call the number listed on your records 48-72 hours following your procedure to check on you and address any questions or concerns that you may have regarding the information given to you following your procedure. If we do not reach you, we will leave a message.  We will attempt to reach you two times.  During this call, we will ask if you have developed any symptoms of COVID 19. If you develop any symptoms (ie:  fever, flu-like symptoms, shortness of breath, cough etc.) before then, please call [phone].  If you test positive for Covid 19 in the 2 weeks post procedure, please call and report this information to [place].    If any biopsies were taken you will be contacted by phone or by letter within the next 1-3 weeks.  Please call us at [phone] if you have not heard about the biopsies in 3 weeks.    SIGNATURES/CONFIDENTIALITY: You and/or your care partner have signed paperwork which will be entered into your electronic medical record.  These signatures attest to  the fact that that the information above on your After Visit Summary has been reviewed and is understood.  Full responsibility of the confidentiality of this discharge information lies with you and/or your care-partner.

## 2020-04-09 NOTE — Op Note (Signed)
[facility] Patient Name: David Curry Procedure Date: 04/09/2020 9:05 AM MRN: [id number] Endoscopist: Gerrit Heck , MD Age: 49 Referring MD:  Date of Birth: July 14, 1971 Gender: Male Account #: [id number] Procedure:                Upper GI endoscopy Indications:              Generalized abdominal pain, Heartburn, Suspected                            esophageal reflux, Diarrhea/Change in bowel habits,                            decreased appetite Medicines:                Monitored Anesthesia Care Procedure:                Pre-Anesthesia Assessment:                           - Prior to the procedure, a History and Physical                            was performed, and patient medications and                            allergies were reviewed. The patient's tolerance of                            previous anesthesia was also reviewed. The risks                            and benefits of the procedure and the sedation                            options and risks were discussed with the patient.                            All questions were answered, and informed consent                            was obtained. Prior Anticoagulants: The patient has                            taken no previous anticoagulant or antiplatelet                            agents. ASA Grade Assessment: II - A patient with                            mild systemic disease. After reviewing the risks                            and benefits, the patient was deemed in  satisfactory condition to undergo the procedure.                           After obtaining informed consent, the endoscope was                            passed under direct vision. Throughout the                            procedure, the patient's blood pressure, pulse, and                            oxygen saturations were monitored continuously. The                            Endoscope was introduced through  the mouth, and                            advanced to the second part of duodenum. The upper                            GI endoscopy was accomplished without difficulty.                            The patient tolerated the procedure well. Scope In: Scope Out: Findings:                 The examined esophagus was normal.                           The Z-line was regular and was found 41 cm from the                            incisors.                           The gastroesophageal flap valve was visualized                            endoscopically and classified as Hill Grade II                            (fold present, opens with respiration).                           Scattered and patchy mild inflammation                            characterized by erosions and erythema was found in                            the gastric fundus, in the gastric body and in the                            gastric antrum. Biopsies were taken  with a cold                            forceps for Helicobacter pylori testing. Estimated                            blood loss was minimal.                           Localized mildly erythematous mucosa without active                            bleeding and with no stigmata of bleeding was found                            in the duodenal bulb. Biopsies were taken with a                            cold forceps for histology. Estimated blood loss                            was minimal.                           The ampulla, first portion of the duodenum and                            second portion of the duodenum were normal.                            Biopsies for histology were taken with a cold                            forceps for evaluation of celiac disease. Estimated                            blood loss was minimal. Complications:            No immediate complications. Estimated Blood Loss:     Estimated blood loss was minimal. Impression:               -  Normal esophagus.                           - Z-line regular, 41 cm from the incisors.                           - Gastroesophageal flap valve classified as Hill                            Grade II (fold present, opens with respiration).                           - Gastritis. Biopsied.                           -  Erythematous duodenopathy. Biopsied.                           - Normal ampulla, first portion of the duodenum and                            second portion of the duodenum. Biopsied. Recommendation:           - Patient has a contact number available for                            emergencies. The signs and symptoms of potential                            delayed complications were discussed with the                            patient. Return to normal activities tomorrow.                            Written discharge instructions were provided to the                            patient.                           - Resume previous diet.                           - Continue present medications.                           - Await pathology results.                           - Increase Protonix (pantoprazole) to 40 mg PO BID                            for 6 weeks to promote mucosal healing, then reduce                            back to 40 mg/day for ongoing control of reflux.                           - Colonoscopy today. Gerrit Heck, MD 04/09/2020 9:46:26 AM

## 2020-04-09 NOTE — Progress Notes (Signed)
To PACU, VSS. Report to [email or web address] 

## 2020-04-09 NOTE — Progress Notes (Signed)
Called to room to assist during endoscopic procedure.  Patient ID and intended procedure confirmed with present staff. Received instructions for my participation in the procedure from the performing physician.  

## 2020-04-09 NOTE — Progress Notes (Signed)
Pt's states no medical or surgical changes since previsit or office visit. 

## 2020-04-11 ENCOUNTER — Telehealth: Payer: Self-pay

## 2020-04-11 ENCOUNTER — Telehealth: Payer: Self-pay | Admitting: *Deleted

## 2020-04-11 NOTE — Telephone Encounter (Signed)
  Follow up Call-  Call back number 04/09/2020  Post procedure Call Back phone  # [phone]  Permission to leave phone message Yes  Some recent data might be hidden     No answer at # given.  Unable to leave message d/t full voicemail box.

## 2020-04-11 NOTE — Telephone Encounter (Signed)
2nd follow up call made.  NAULM 

## 2020-04-12 ENCOUNTER — Encounter: Payer: Self-pay | Admitting: Gastroenterology

## 2021-11-25 ENCOUNTER — Emergency Department (HOSPITAL_COMMUNITY)
Admission: EM | Admit: 2021-11-25 | Discharge: 2021-11-26 | Disposition: A | Discharge status: Home / Self Care | Payer: No Typology Code available for payment source | Attending: Emergency Medicine | Admitting: Emergency Medicine

## 2021-11-25 ENCOUNTER — Encounter (HOSPITAL_COMMUNITY): Payer: Self-pay | Admitting: *Deleted

## 2021-11-25 ENCOUNTER — Other Ambulatory Visit: Payer: Self-pay

## 2021-11-25 ENCOUNTER — Emergency Department (HOSPITAL_COMMUNITY): Payer: No Typology Code available for payment source

## 2021-11-25 DIAGNOSIS — R002 Palpitations: Secondary | ICD-10-CM | POA: Insufficient documentation

## 2021-11-25 DIAGNOSIS — Z79899 Other long term (current) drug therapy: Secondary | ICD-10-CM | POA: Insufficient documentation

## 2021-11-25 DIAGNOSIS — I10 Essential (primary) hypertension: Secondary | ICD-10-CM

## 2021-11-25 HISTORY — DX: Sleep apnea, unspecified: G47.30

## 2021-11-25 HISTORY — DX: Post-traumatic stress disorder, unspecified: F43.10

## 2021-11-25 LAB — BASIC METABOLIC PANEL
Anion gap: 8 (ref 5–15)
BUN: 12 mg/dL (ref 6–20)
CO2: 27 mmol/L (ref 22–32)
Calcium: 9.2 mg/dL (ref 8.9–10.3)
Chloride: 102 mmol/L (ref 98–111)
Creatinine, Ser: 0.97 mg/dL (ref 0.61–1.24)
GFR, Estimated: >60 mL/min (ref >60)
Glucose, Bld: 90 mg/dL (ref 70–99)
Potassium: 3.3 mmol/L — ABNORMAL LOW (ref 3.5–5.1)
Sodium: 137 mmol/L (ref 135–145)

## 2021-11-25 LAB — CBC
HCT: 43.7 % (ref 39.0–52.0)
Hemoglobin: 15 g/dL (ref 13.0–17.0)
MCH: 29.1 pg (ref 26.0–34.0)
MCHC: 34.3 g/dL (ref 30.0–36.0)
MCV: 84.7 fL (ref 80.0–100.0)
Platelets: 297 10*3/uL (ref 150–400)
RBC: 5.16 MIL/uL (ref 4.22–5.81)
RDW: 12.7 % (ref 11.5–15.5)
WBC: 7 10*3/uL (ref 4.0–10.5)
nRBC: 0 % (ref 0.0–0.2)

## 2021-11-25 LAB — TSH: TSH: 2.143 u[IU]/mL (ref 0.350–4.500)

## 2021-11-25 LAB — TROPONIN I (HIGH SENSITIVITY)
Troponin I (High Sensitivity): 6 ng/L (ref <18)
Troponin I (High Sensitivity): 6 ng/L (ref <18)

## 2021-11-25 NOTE — ED Triage Notes (Signed)
Pt reporting intermittent chest palpitations for about 2 days, pain in the right back yesterday. Denies SOB. Pt has hx of HTN, reports he is taking his medications as prescribed.

## 2021-11-25 NOTE — ED Notes (Signed)
Pt states that he wants his blood pressure taken in the back and not the lobby because it is going to be high due to his anxiety and PTSD

## 2021-11-25 NOTE — ED Notes (Signed)
PT refusing vitals

## 2021-11-25 NOTE — ED Provider Notes (Signed)
Emergency Medicine Provider Triage Evaluation Note  David Curry , a 51 y.o. male  was evaluated in triage.  Pt complains of palpitations.  History of poorly controlled hypertension.  He follows at the [facility] hospital.  Patient states that for the past 4 days he has had intermittent episodes of palpitations.  This is new.  He has felt very lightheaded and tired.  He does not drink alcohol.  He is on amlodipine and lisinopril for his blood pressure control.  He denies fevers, chills, cough.  Review of Systems  Positive: Palpitation Negative: Fever chill  Physical Exam  BP (!) 208/124    Pulse 84    Temp 98.5 F (36.9 C) (Oral)    Resp 18    Ht 5\' 8"  (1.727 m)    Wt 99.8 kg    SpO2 99%    BMI 33.45 kg/m  Gen:   Awake, no distress   Resp:  Normal effort  MSK:   Moves extremities without difficulty  Other:  No murmur  Medical Decision Making  Medically screening exam initiated at 8:48 PM.  Appropriate orders placed.  David Curry was informed that the remainder of the evaluation will be completed by another provider, this initial triage assessment does not replace that evaluation, and the importance of remaining in the ED until their evaluation is complete.  Palpitations.  Work-up initiated   Margarita Mail, PA-C 11/25/21 2050    Teressa Lower, MD 11/26/21 [phone]

## 2021-11-26 MED ORDER — AMLODIPINE BESYLATE 5 MG PO TABS
10.0000 mg | ORAL_TABLET | Freq: Once | ORAL | Status: AC
  Administered 2021-11-26: 10 mg via ORAL
  Filled 2021-11-26: qty 2

## 2021-11-26 NOTE — ED Notes (Signed)
Patient refused vitalsign

## 2021-11-26 NOTE — ED Provider Notes (Signed)
[name] EMERGENCY DEPARTMENT Provider Note   CSN: [id number] Arrival date & time: 11/25/21  2000     History  Chief Complaint  Patient presents with   Palpitations    David Curry is a 51 y.o. male with PMHx of HTN, PTSD, anxiety and OSA on CPAP presented with c/o palpitations for the past 3 days. He states the palpitations are intermittent throughout the day and worse with activity. He has experienced palpitations in the past but states this time is at its worse which prompted ED visit. Patient denies headache, CP, SOB, lightheadedness/dizziness, syncope or weakness of the extremities. He believes his symptoms are associated with uncontrolled BP and anxiety. Patient takes amlodipine and lisionpril at home, and states he sometimes forgets to take them. He uses his CPAP machine intermittently, states he has sinus congestion which prevents him from using his CPAP consistently.   Palpitations Associated symptoms: no chest pain, no dizziness, no numbness, no shortness of breath and no weakness       Home Medications Prior to Admission medications   Medication Sig Start Date End Date Taking? Authorizing Provider  amLODipine (NORVASC) 10 MG tablet Take by mouth daily.    [provider]  citalopram (CELEXA) 10 MG tablet Take 10 mg by mouth daily.    [provider]  hydrOXYzine (ATARAX/VISTARIL) 25 MG tablet Take 25 mg by mouth 3 (three) times daily as needed.    [provider]  ibuprofen (ADVIL) 800 MG tablet Take 800 mg by mouth every 8 (eight) hours as needed.    [provider]  LACTOBACILLUS PO Take by mouth.    [provider]  losartan (COZAAR) 50 MG tablet Take by mouth daily.    [provider]  pantoprazole (PROTONIX) 40 MG tablet Take 40 mg by mouth daily.    [provider]  pantoprazole (PROTONIX) 40 MG tablet Take 1 tablet (40 mg total) by mouth 2 (two) times daily. 04/09/20 05/21/20   Cirigliano, Vito V, DO  psyllium (METAMUCIL) 58.6 % powder Take 1 packet by mouth 3 (three) times daily.    [provider]  sildenafil (VIAGRA) 100 MG tablet Take 100 mg by mouth daily as needed for erectile dysfunction.    [provider]  traZODone (DESYREL) 50 MG tablet Take 50 mg by mouth at bedtime.    [provider]      Allergies    Percocet [oxycodone-acetaminophen]    Review of Systems   Review of Systems  Eyes:  Negative for visual disturbance.  Respiratory:  Negative for chest tightness and shortness of breath.   Cardiovascular:  Positive for palpitations. Negative for chest pain.  Neurological:  Negative for dizziness, syncope, weakness, light-headedness, numbness and headaches.   Physical Exam Updated Vital Signs BP (!) 182/129    Pulse 70    Temp 98.2 F (36.8 C) (Oral)    Resp 18    Ht 5\' 8"  (1.727 m)    Wt 99.8 kg    SpO2 99%    BMI 33.45 kg/m  Physical Exam Constitutional:      General: He is awake. He is not in acute distress.    Appearance: Normal appearance.  HENT:     Head: Normocephalic and atraumatic.  Cardiovascular:     Rate and Rhythm: Normal rate.     Heart sounds: Normal heart sounds.  Pulmonary:     Effort: Pulmonary effort is normal.     Breath sounds: Normal breath  sounds and air entry.  Abdominal:     General: Bowel sounds are normal.  Skin:    General: Skin is warm and dry.  Neurological:     General: No focal deficit present.     Mental Status: He is alert and oriented to person, place, and time.  Psychiatric:        Behavior: Behavior normal. Behavior is cooperative.    ED Results / Procedures / Treatments   Labs (all labs ordered are listed, but only abnormal results are displayed) Labs Reviewed  BASIC METABOLIC PANEL - Abnormal; Notable for the following components:      Result Value   Potassium 3.3 (*)    All other components within normal limits  CBC  TSH  TROPONIN I (HIGH SENSITIVITY)  TROPONIN  I (HIGH SENSITIVITY)    EKG EKG: normal EKG, normal sinus rhythm, unchanged from previous tracings.   Radiology DG Chest 2 View  Result Date: 11/25/2021 CLINICAL DATA:  Cardiac palpitations. EXAM: CHEST - 2 VIEW COMPARISON:  June 01, 2012. FINDINGS: The heart size and mediastinal contours are within normal limits. Both lungs are clear. The visualized skeletal structures are unremarkable. IMPRESSION: No active cardiopulmonary disease. Electronically Signed   By: Marijo Conception M.D.   On: 11/25/2021 21:05    Procedures Procedures    Medications Ordered in ED Medications  amLODipine (NORVASC) tablet 10 mg (has no administration in time range)    ED Course/ Medical Decision Making/ A&P                           Medical Decision Making Palpitations Pt presents with asymptomatic severe hypertension. Pt endorse medication non-adherence. Likely been having elevated BP for quite some time. Pt complains of intermittent palpitation, which also occurs in the setting of anxiety. Trops, CBC, BMP are reassuring. EKG not concerning for cardiac abnormalities. Pt appeared comfortable during exam and denies any symptoms. Pt was given his home dose antihypertensive, amlodipine 10mg  to slowly bring down his blood pressure. Pt will likely benefit from outpatient cardiac workup.         Final Clinical Impression(s) / ED Diagnoses Final diagnoses:  None    Rx / DC Orders ED Discharge Orders     None         Timothy Lasso, MD 11/26/21 1740    Lucrezia Starch, MD 11/26/21 1626

## 2021-11-26 NOTE — Discharge Instructions (Signed)
Follow up with your primary care doctor. Please take your blood pressure medications daily as prescribed.

## 2022-10-27 IMAGING — CR DG CHEST 2V
2 series · 2 of 2 positions shown · non-contrast
Comparison: June 01, 2012.

CLINICAL DATA: Cardiac palpitations.

EXAM:
CHEST - 2 VIEW

[chest pa]
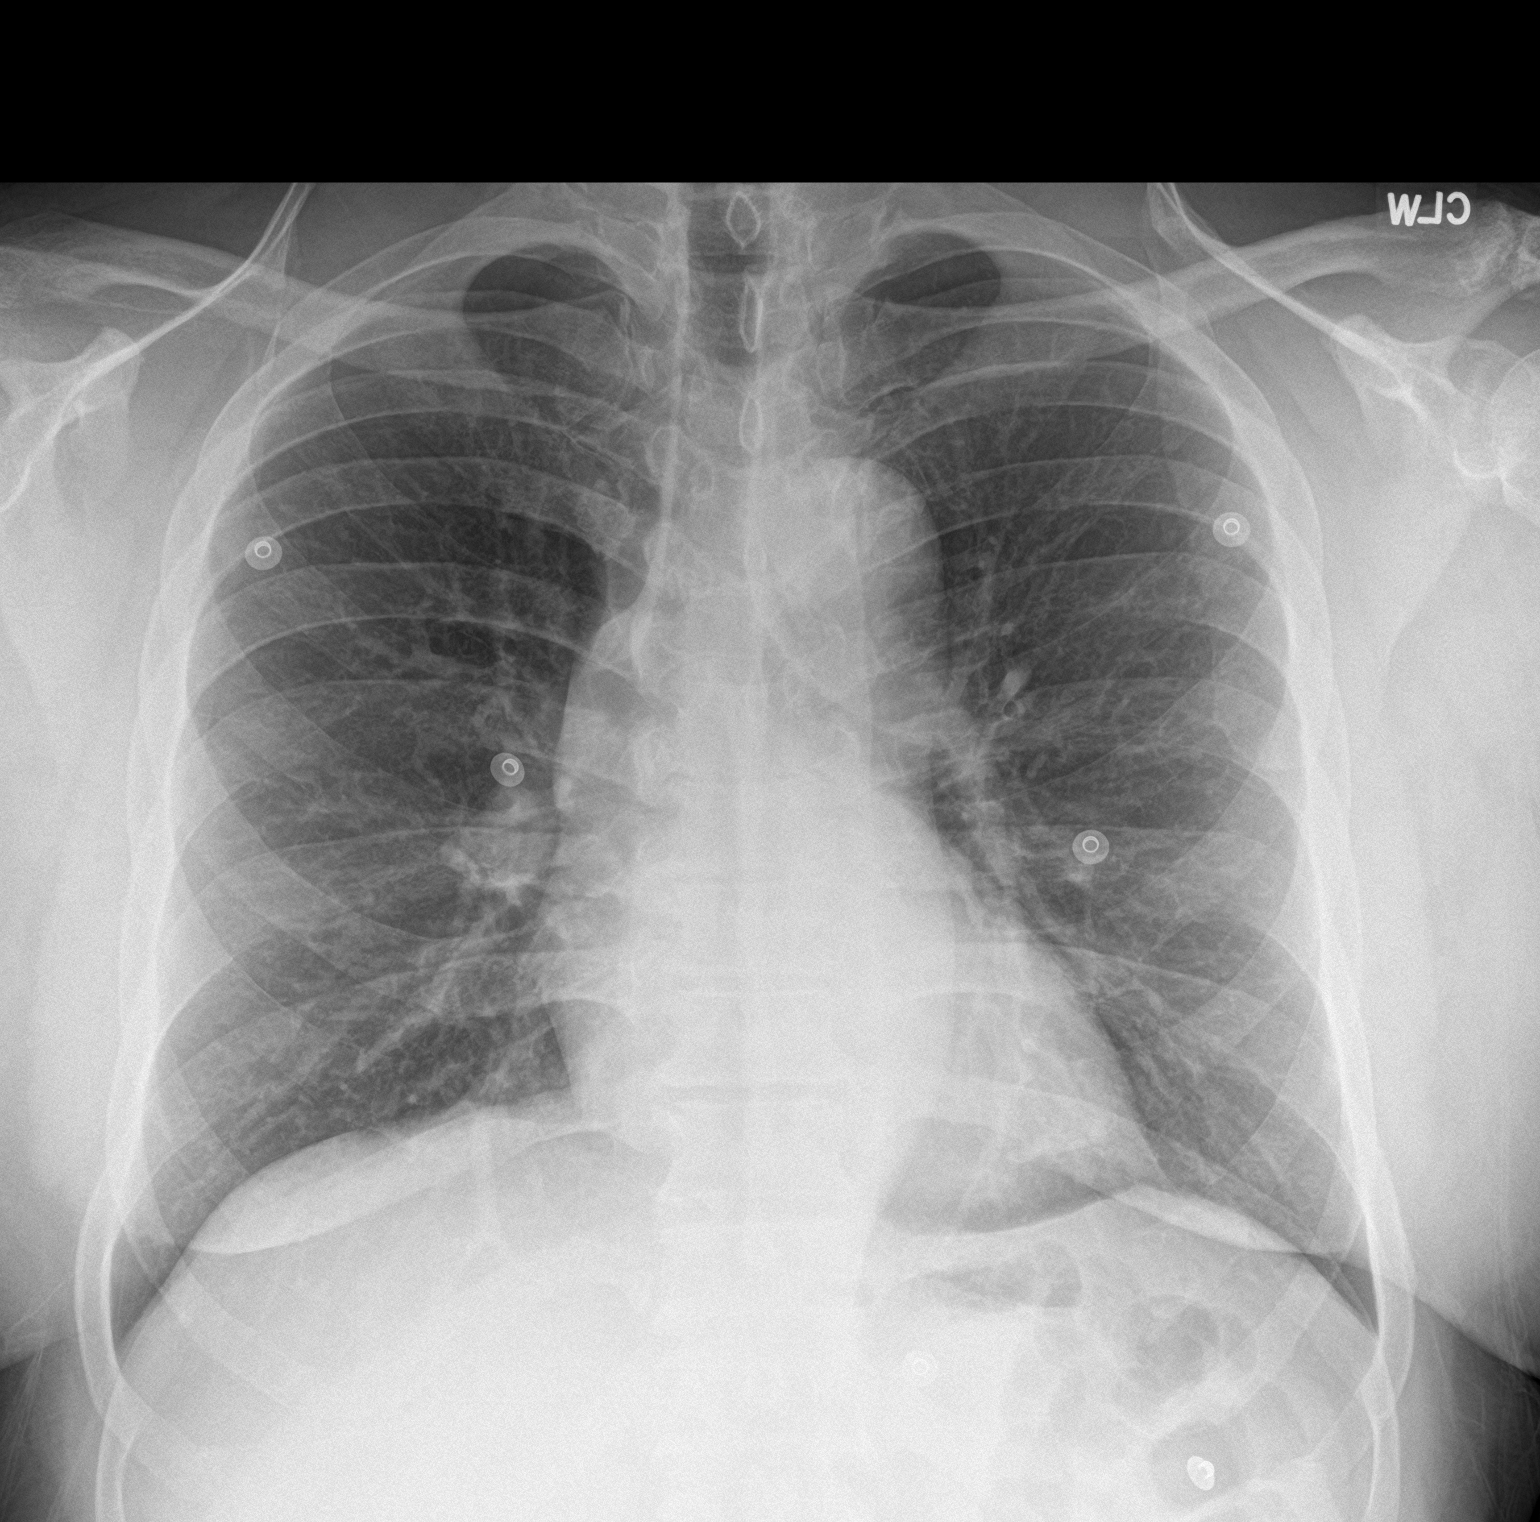

[chest lat]
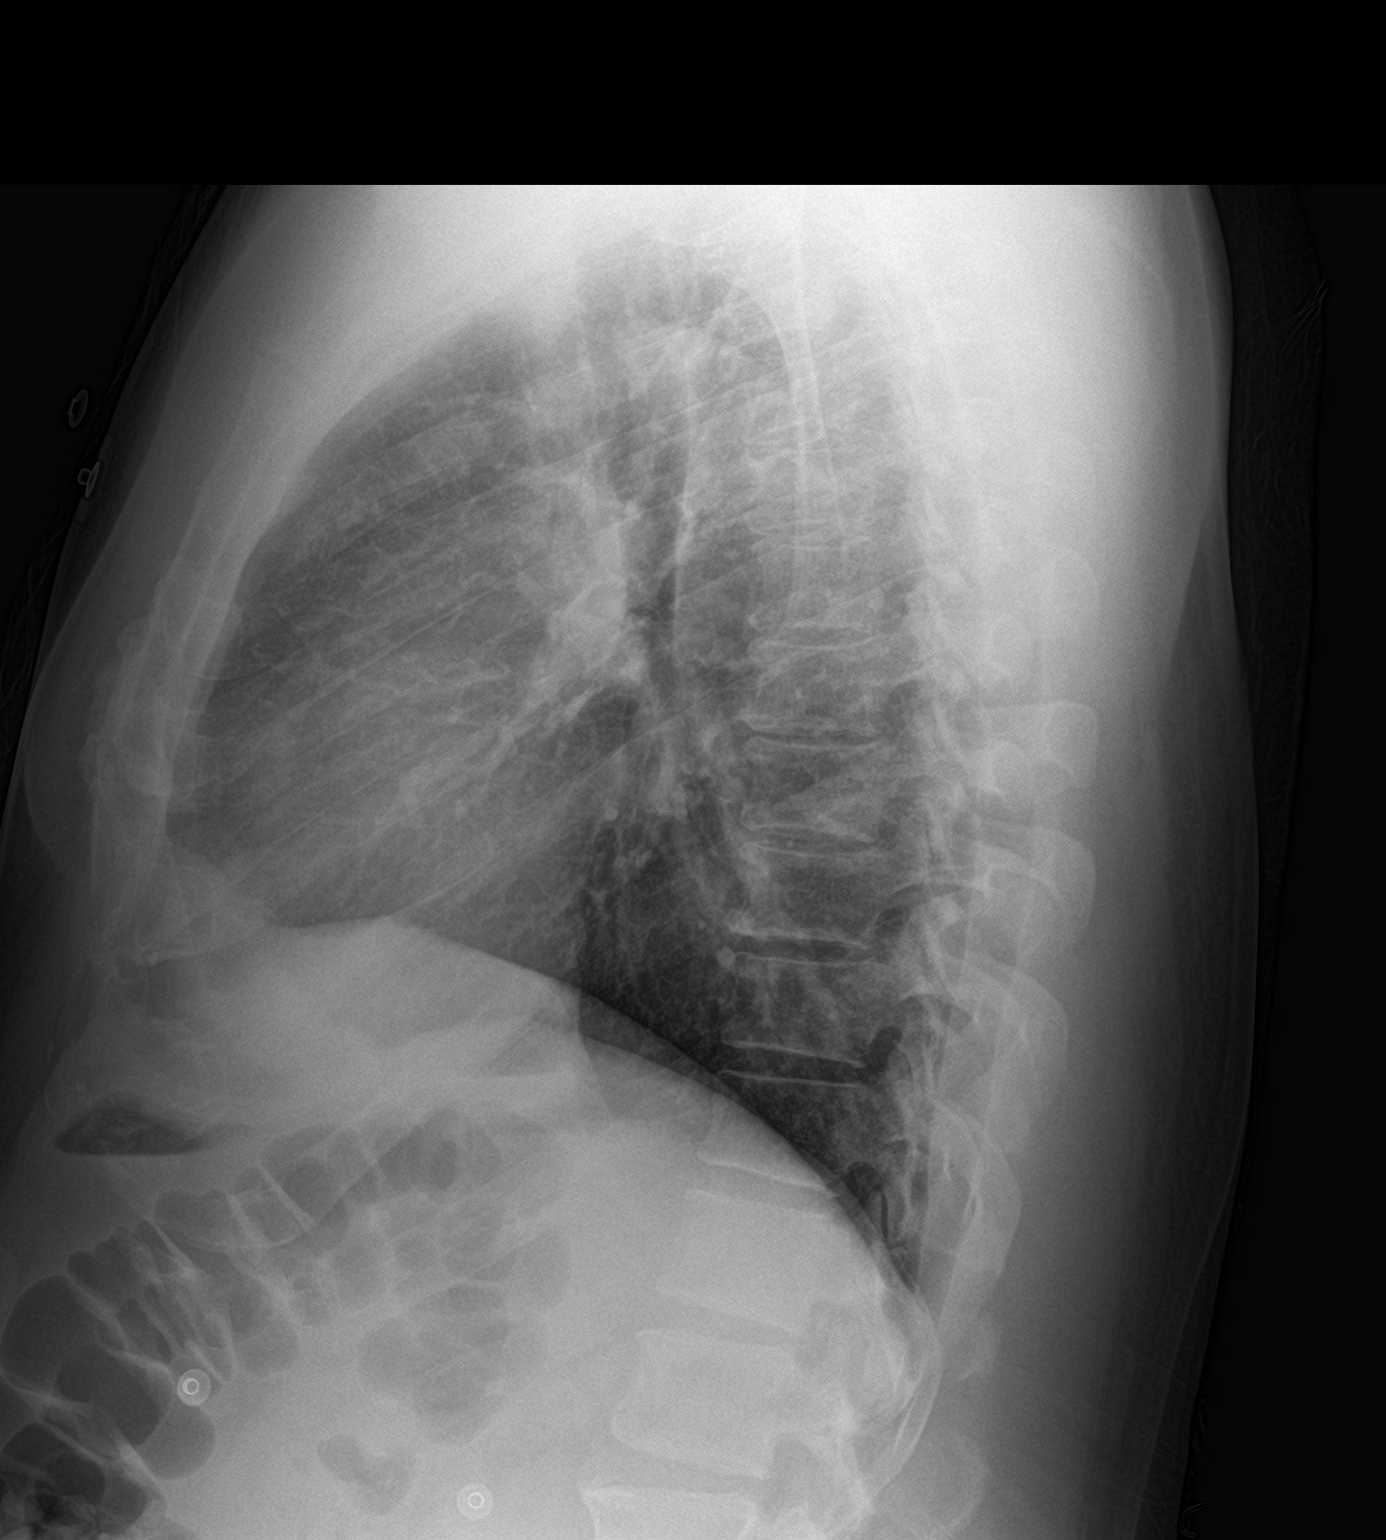

[2 of 2 positions shown; findings below may reference images not displayed]

FINDINGS: The heart size and mediastinal contours are within normal limits.
Both lungs are clear. The visualized skeletal structures are
unremarkable.
IMPRESSION: No active cardiopulmonary disease.

## 2023-10-11 ENCOUNTER — Encounter: Payer: Self-pay | Admitting: Gastroenterology
